# Patient Record
Sex: Male | Born: 1950 | Race: Black or African American | Hispanic: No | Marital: Married
Health system: Southern US, Community
[De-identification: ages and names within clinical notes are randomized; demographics above are authoritative.]

---

## 2010-07-28 ENCOUNTER — Ambulatory Visit: Payer: Self-pay | Admitting: Internal Medicine

## 2010-08-06 ENCOUNTER — Ambulatory Visit: Payer: Self-pay | Admitting: Unknown Physician Specialty

## 2012-04-03 IMAGING — CR RIGHT INDEX FINGER 2+V
1 series · 3 of 3 positions shown · non-contrast
Comparison: none

REASON FOR EXAM: deep laceration on metal bin
COMMENTS:   LMP: (Male)

[Series 1: view not recorded · 0.17mm/px · 3 of 3 slices shown]
[im 1/3]
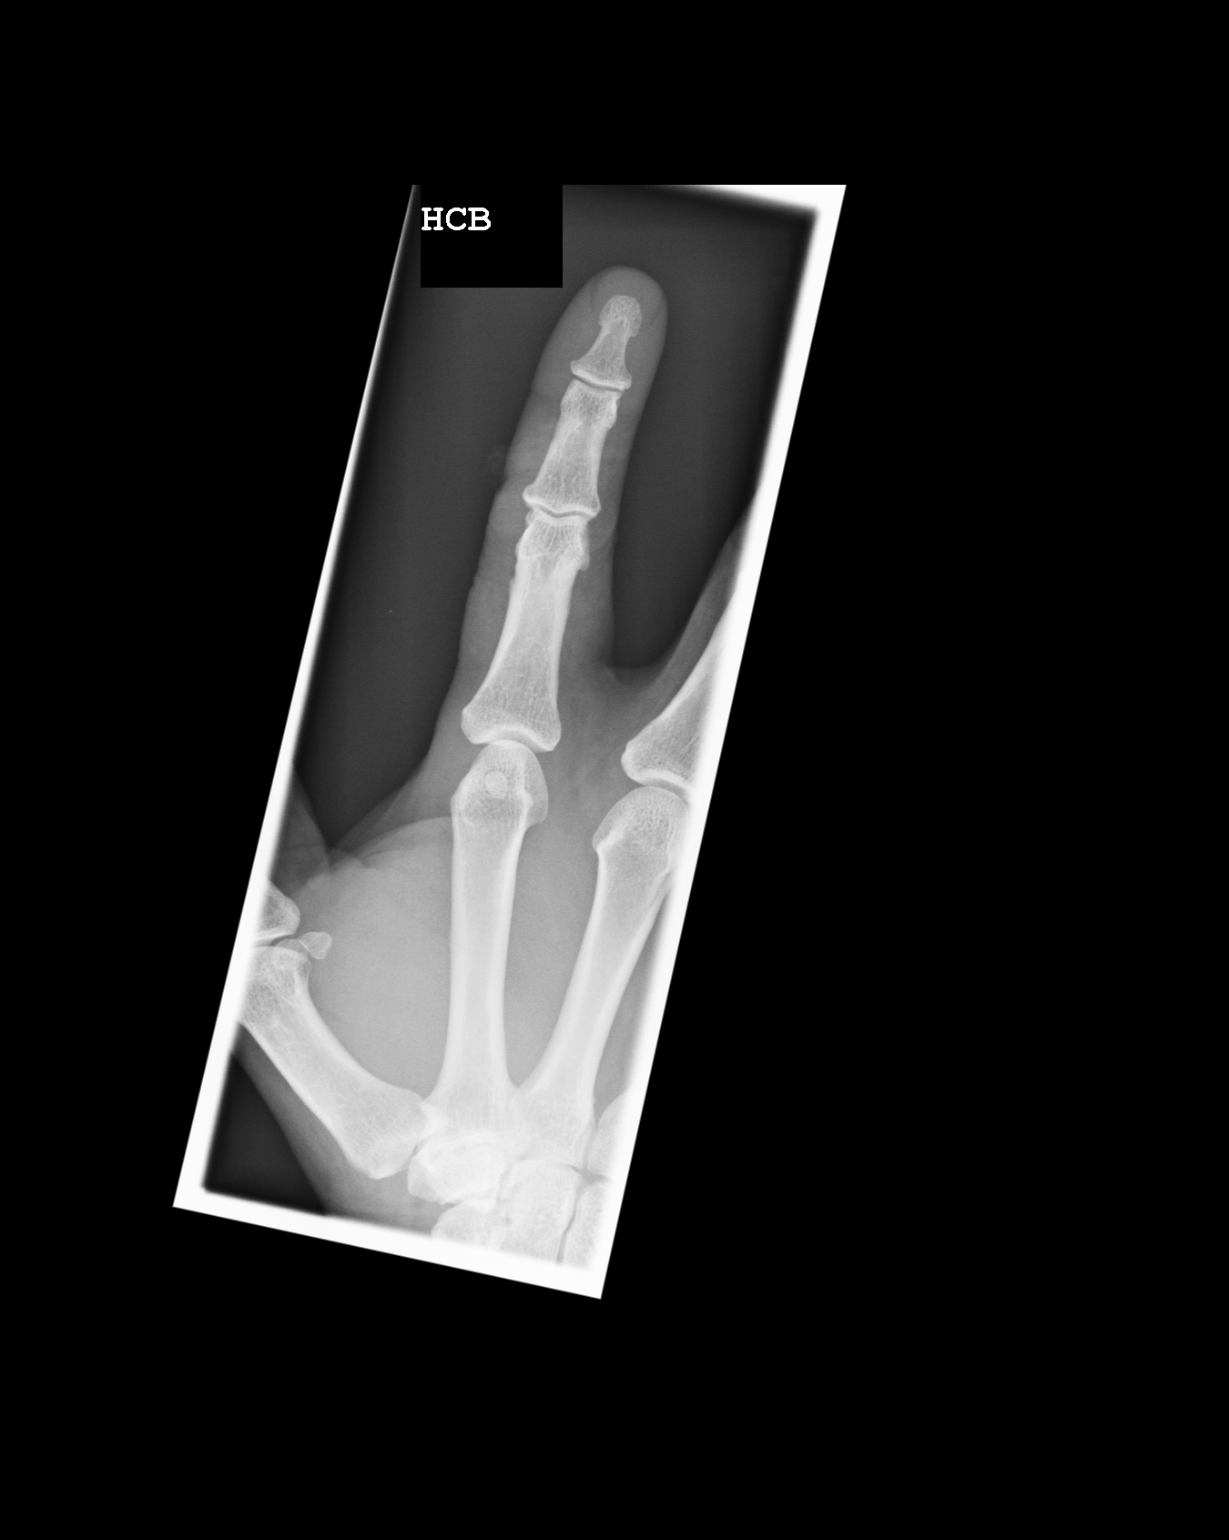
[im 2/3]
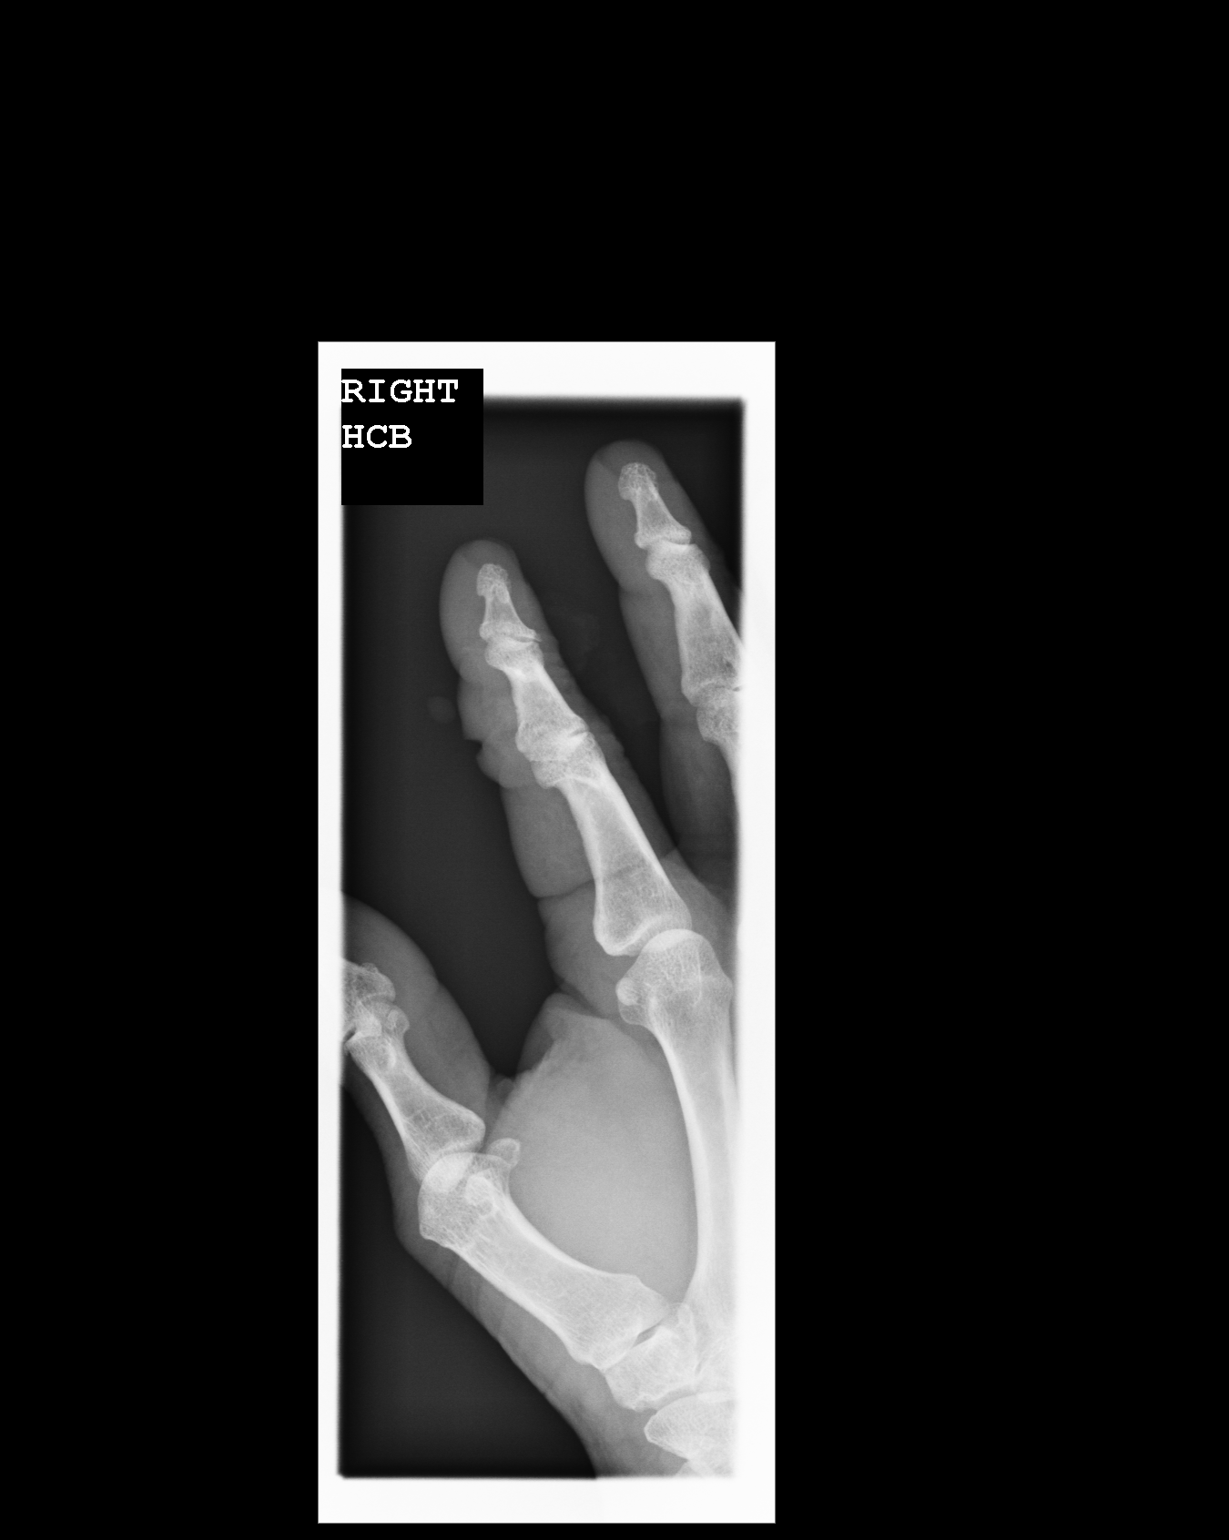
[im 3/3]
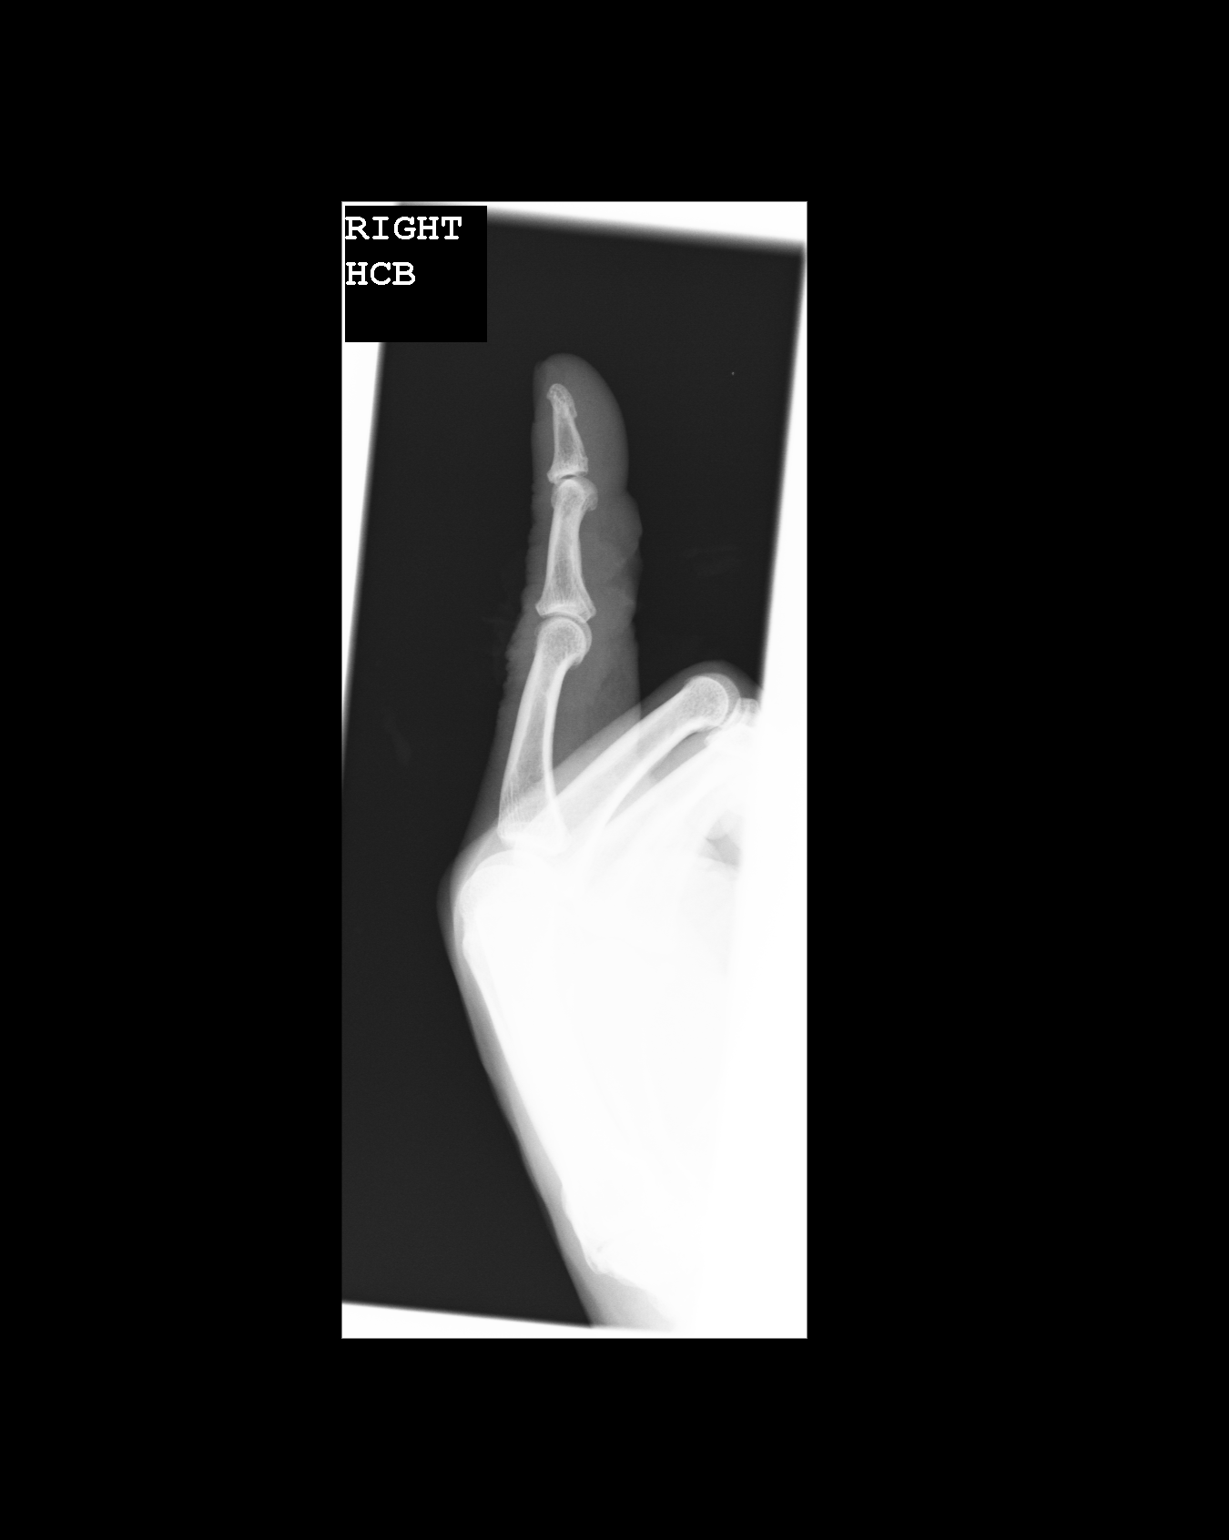

[3 of 3 positions shown; findings below may reference images not displayed]

PROCEDURE:     MDR - MDR FINGER INDEX 2ND DIG RT HA  - July 28, 2010  [DATE]

RESULT:     Three views of the right index finger are submitted. There is
disruption of the ventral soft tissues overlying the middle phalanx. I see
no abnormality of the underlying bone or joints. No foreign bodies are
identified.
IMPRESSION: The patient sustained a laceration of the mid aspect of the
right index finger. No bony abnormality is seen and no foreign bodies are
demonstrated.

## 2014-03-29 ENCOUNTER — Telehealth: Payer: Self-pay | Admitting: Neurology

## 2014-05-16 NOTE — Telephone Encounter (Signed)
error
